# Patient Record
Sex: Male | Born: 1985 | Race: White | Hispanic: No | Marital: Married | Smoking: Never smoker
Health system: Southern US, Community
[De-identification: ages and names within clinical notes are randomized; demographics above are authoritative.]

## PROBLEM LIST (undated history)

## (undated) DIAGNOSIS — K602 Anal fissure, unspecified: Secondary | ICD-10-CM

## (undated) DIAGNOSIS — A048 Other specified bacterial intestinal infections: Secondary | ICD-10-CM

## (undated) DIAGNOSIS — T7840XA Allergy, unspecified, initial encounter: Secondary | ICD-10-CM

## (undated) HISTORY — DX: Anal fissure, unspecified: K60.2

## (undated) HISTORY — DX: Other specified bacterial intestinal infections: A04.8

## (undated) HISTORY — DX: Allergy, unspecified, initial encounter: T78.40XA

---

## 2012-03-27 ENCOUNTER — Encounter: Payer: Self-pay | Admitting: Family Medicine

## 2012-03-27 ENCOUNTER — Ambulatory Visit (INDEPENDENT_AMBULATORY_CARE_PROVIDER_SITE_OTHER): Payer: BC Managed Care – PPO | Admitting: Family Medicine

## 2012-03-27 VITALS — BP 116/72 | HR 61 | Temp 98.0°F | Resp 16 | Ht 69.0 in | Wt 207.6 lb

## 2012-03-27 DIAGNOSIS — E669 Obesity, unspecified: Secondary | ICD-10-CM | POA: Insufficient documentation

## 2012-03-27 DIAGNOSIS — Z800 Family history of malignant neoplasm of digestive organs: Secondary | ICD-10-CM | POA: Insufficient documentation

## 2012-03-27 DIAGNOSIS — K5900 Constipation, unspecified: Secondary | ICD-10-CM | POA: Insufficient documentation

## 2012-03-27 DIAGNOSIS — K625 Hemorrhage of anus and rectum: Secondary | ICD-10-CM

## 2012-03-27 DIAGNOSIS — Z0000 Encounter for general adult medical examination without abnormal findings: Secondary | ICD-10-CM

## 2012-03-27 LAB — POCT URINALYSIS DIPSTICK
Bilirubin, UA: NEGATIVE
Ketones, UA: NEGATIVE
Leukocytes, UA: NEGATIVE

## 2012-03-27 NOTE — Patient Instructions (Signed)

## 2012-03-27 NOTE — Progress Notes (Addendum)
Subjective:    Patient ID: Russell Bowen, male    DOB: 1986-02-11, 26 y.o.   MRN: [id number]  HPI   This 26 y.o. Male is here for CPE ( employee incentive program); he had labs drawn earlier and results  to be forwarded or sent to [place]. 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 He   does no strength training but is interested in weight reduction. He c/o anal bleeding associated with con-  stipation which is infrequent. He eats cereal most days of the week ( not a bran cereal). He does eat prunes  and some other fruits and vegetables.   Last eye exam: within last 2 years  Immunizations: UTD   Review of Systems  Constitutional: Negative.   HENT: Negative.   Eyes: Negative.   Respiratory: Negative.   Cardiovascular: Negative.   Gastrointestinal: Positive for constipation, blood in stool and anal bleeding. Negative for nausea, vomiting, abdominal pain, diarrhea and rectal pain.  Genitourinary: Negative.   Musculoskeletal: Negative.   Skin: Negative.   Neurological: Negative.   Hematological: Negative.   Psychiatric/Behavioral: Negative.        Objective:   Physical Exam  Nursing note and vitals reviewed. Constitutional: He is oriented to person, place, and time. He appears well-developed and well-nourished. No distress.  HENT:  Head: Normocephalic and atraumatic.  Right Ear: External ear normal.  Left Ear: External ear normal.  Nose: Nose normal.  Mouth/Throat: Oropharynx is clear and moist.  Eyes: Conjunctivae and EOM are normal. No scleral icterus.  Neck: Normal range of motion. Neck supple.       Thyroid slightly enlarged (symmetric)- no nodules, nontender  Cardiovascular: Normal rate, regular rhythm and normal heart sounds.  Exam reveals no gallop and no friction rub.   No murmur heard. Pulmonary/Chest: Breath sounds normal. No respiratory distress.  Abdominal: Soft. Bowel sounds are normal. He exhibits no distension and no mass. There is no  tenderness. There is no guarding.       No organomegaly  Genitourinary: Rectum normal, testes normal and penis normal. Right testis shows no mass, no swelling and no tenderness. Right testis is descended. Left testis shows no mass, no swelling and no tenderness. Left testis is descended.       Anus sphincter appears very tight  Musculoskeletal: Normal range of motion. He exhibits no edema and no tenderness.  Lymphadenopathy:    He has no cervical adenopathy.       Right: No inguinal adenopathy present.       Left: No inguinal adenopathy present.  Neurological: He is alert and oriented to person, place, and time. He has normal reflexes. No cranial nerve deficit. He exhibits normal muscle tone. Coordination normal.  Skin: Skin is warm and dry. No rash noted. No erythema. No pallor.  Psychiatric: He has a normal mood and affect. His behavior is normal. Judgment and thought content normal.   Results for orders placed in visit on 03/27/12  POCT URINALYSIS DIPSTICK      Component Value Range   Color, UA yellow     Clarity, UA clear     Glucose, UA neg     Bilirubin, UA neg     Ketones, UA neg     Spec Grav, UA 1.020     Blood, UA neg     pH, UA 6.0     Protein, UA neg     Urobilinogen, UA 0.2     Nitrite, UA  neg     Leukocytes, UA Negative            Assessment & Plan:   1. Routine general medical examination at a health care facility  Recent labs to be forwarded to our facility  2. Anal bleeding - suspect anal fissure (Family Hx of Colon Cancer) Hemoccult - Multi Cards (take home)  3. Constipation  Print info given; pt to make appropriate changes in lifestyle and diet   4. Obesity (BMI 30.0-34.9)  Encouraged strength training-start with push-ups, squats, pull-ups, etc.to work large muscle groups

## 2012-03-30 LAB — POC HEMOCCULT BLD/STL (HOME/3-CARD/SCREEN): Card #1 Date: NEGATIVE

## 2012-03-30 NOTE — Progress Notes (Signed)
Addended by: Gerrianne Scale on: 03/30/2012 02:25 PM   Modules accepted: Orders

## 2012-04-24 ENCOUNTER — Encounter: Payer: Self-pay | Admitting: Family Medicine

## 2013-07-31 ENCOUNTER — Ambulatory Visit (INDEPENDENT_AMBULATORY_CARE_PROVIDER_SITE_OTHER): Payer: BC Managed Care – PPO | Admitting: Family Medicine

## 2013-07-31 ENCOUNTER — Encounter: Payer: Self-pay | Admitting: Family Medicine

## 2013-07-31 VITALS — BP 116/70 | HR 63 | Temp 98.4°F | Resp 16 | Ht 69.0 in | Wt 207.6 lb

## 2013-07-31 DIAGNOSIS — J309 Allergic rhinitis, unspecified: Secondary | ICD-10-CM

## 2013-07-31 DIAGNOSIS — K5900 Constipation, unspecified: Secondary | ICD-10-CM

## 2013-07-31 DIAGNOSIS — K602 Anal fissure, unspecified: Secondary | ICD-10-CM

## 2013-07-31 DIAGNOSIS — K219 Gastro-esophageal reflux disease without esophagitis: Secondary | ICD-10-CM

## 2013-07-31 LAB — CBC WITH DIFFERENTIAL/PLATELET
Basophils Absolute: 0 10*3/uL (ref 0.0–0.1)
Lymphocytes Relative: 50 % — ABNORMAL HIGH (ref 12–46)
Lymphs Abs: 2.5 10*3/uL (ref 0.7–4.0)
MCH: 29.5 pg (ref 26.0–34.0)
Neutro Abs: 2.1 10*3/uL (ref 1.7–7.7)
Neutrophils Relative %: 41 % — ABNORMAL LOW (ref 43–77)
Platelets: 249 10*3/uL (ref 150–400)
RBC: 4.74 MIL/uL (ref 4.22–5.81)
RDW: 12.8 % (ref 11.5–15.5)
WBC: 5 10*3/uL (ref 4.0–10.5)

## 2013-07-31 LAB — COMPREHENSIVE METABOLIC PANEL
CO2: 27 mEq/L (ref 19–32)
Creat: 0.69 mg/dL (ref 0.50–1.35)
Glucose, Bld: 82 mg/dL (ref 70–99)
Total Bilirubin: 0.6 mg/dL (ref 0.3–1.2)
Total Protein: 7.1 g/dL (ref 6.0–8.3)

## 2013-07-31 LAB — TSH: TSH: 1.59 u[IU]/mL (ref 0.350–4.500)

## 2013-07-31 MED ORDER — MONTELUKAST SODIUM 10 MG PO TABS: 10.0000 mg | ORAL_TABLET | Freq: Every day | ORAL | Status: DC

## 2013-07-31 MED ORDER — OMEPRAZOLE 20 MG PO CPDR: 20.0000 mg | DELAYED_RELEASE_CAPSULE | Freq: Every day | ORAL | Status: DC

## 2013-07-31 MED ORDER — MOMETASONE FUROATE 50 MCG/ACT NA SUSP: 2.0000 | Freq: Every day | NASAL | Status: DC

## 2013-07-31 NOTE — Patient Instructions (Signed)
Cough, Adult  A cough is a reflex that helps clear your throat and airways. It can help heal the body or may be a reaction to an irritated airway. A cough may only last 2 or 3 weeks (acute) or may last more than 8 weeks (chronic).  CAUSES Acute cough:  Viral or bacterial infections. Chronic cough:  Infections.  Allergies.  Asthma.  Post-nasal drip.  Smoking.  Heartburn or acid reflux.  Some medicines.  Chronic lung problems (COPD).  Cancer. SYMPTOMS   Cough.  Fever.  Chest pain.  Increased breathing rate.  High-pitched whistling sound when breathing (wheezing).  Colored mucus that you cough up (sputum). TREATMENT   A bacterial cough may be treated with antibiotic medicine.  A viral cough must run its course and will not respond to antibiotics.  Your caregiver may recommend other treatments if you have a chronic cough. HOME CARE INSTRUCTIONS   Only take over-the-counter or prescription medicines for pain, discomfort, or fever as directed by your caregiver. Use cough suppressants only as directed by your caregiver.  Use a cold steam vaporizer or humidifier in your bedroom or home to help loosen secretions.  Sleep in a semi-upright position if your cough is worse at night.  Rest as needed.  Stop smoking if you smoke. SEEK IMMEDIATE MEDICAL CARE IF:   You have pus in your sputum.  Your cough starts to worsen.  You cannot control your cough with suppressants and are losing sleep.  You begin coughing up blood.  You have difficulty breathing.  You develop pain which is getting worse or is uncontrolled with medicine.  You have a fever. MAKE SURE YOU:   Understand these instructions.  Will watch your condition.  Will get help right away if you are not doing well or get worse. Document Released: 04/13/2011 Document Revised: 01/07/2012 Document Reviewed: 04/13/2011 [facility] Patient Information 2014 [place], [place].   Gastroesophageal Reflux  Disease, Adult Gastroesophageal reflux disease (GERD) happens when acid from your stomach flows up into the esophagus. When acid comes in contact with the esophagus, the acid causes soreness (inflammation) in the esophagus. Over time, GERD may create small holes (ulcers) in the lining of the esophagus. CAUSES   Increased body weight. This puts pressure on the stomach, making acid rise from the stomach into the esophagus.  Smoking. This increases acid production in the stomach.  Drinking alcohol. This causes decreased pressure in the lower esophageal sphincter (valve or ring of muscle between the esophagus and stomach), allowing acid from the stomach into the esophagus.  Late evening meals and a full stomach. This increases pressure and acid production in the stomach.  A malformed lower esophageal sphincter. Sometimes, no cause is found. SYMPTOMS   Burning pain in the lower part of the mid-chest behind the breastbone and in the mid-stomach area. This may occur twice a week or more often.  Trouble swallowing.  Sore throat.  Dry cough.  Asthma-like symptoms including chest tightness, shortness of breath, or wheezing. DIAGNOSIS  Your caregiver may be able to diagnose GERD based on your symptoms. In some cases, X-rays and other tests may be done to check for complications or to check the condition of your stomach and esophagus. TREATMENT  Your caregiver may recommend over-the-counter or prescription medicines to help decrease acid production. Ask your caregiver before starting or adding any new medicines.  HOME CARE INSTRUCTIONS   Change the factors that you can control. Ask your caregiver for guidance concerning weight loss, quitting smoking,  and alcohol consumption.  Avoid foods and drinks that make your symptoms worse, such as:  Caffeine or alcoholic drinks.  Chocolate.  Peppermint or mint flavorings.  Garlic and onions.  Spicy foods.  Citrus fruits, such as oranges,  lemons, or limes.  Tomato-based foods such as sauce, chili, salsa, and pizza.  Fried and fatty foods.  Avoid lying down for the 3 hours prior to your bedtime or prior to taking a nap.  Eat small, frequent meals instead of large meals.  Wear loose-fitting clothing. Do not wear anything tight around your waist that causes pressure on your stomach.  Raise the head of your bed 6 to 8 inches with wood blocks to help you sleep. Extra pillows will not help.  Only take over-the-counter or prescription medicines for pain, discomfort, or fever as directed by your caregiver.  Do not take aspirin, ibuprofen, or other nonsteroidal anti-inflammatory drugs (NSAIDs). SEEK IMMEDIATE MEDICAL CARE IF:   You have pain in your arms, neck, jaw, teeth, or back.  Your pain increases or changes in intensity or duration.  You develop nausea, vomiting, or sweating (diaphoresis).  You develop shortness of breath, or you faint.  Your vomit is green, yellow, black, or looks like coffee grounds or blood.  Your stool is red, bloody, or black. These symptoms could be signs of other problems, such as heart disease, gastric bleeding, or esophageal bleeding. MAKE SURE YOU:   Understand these instructions.  Will watch your condition.  Will get help right away if you are not doing well or get worse. Document Released: 07/25/2005 Document Revised: 01/07/2012 Document Reviewed: 05/04/2011 [facility] Patient Information 2014 [place], [place].   Diet for Gastroesophageal Reflux Disease, Adult Reflux (acid reflux) is when acid from your stomach flows up into the esophagus. When acid comes in contact with the esophagus, the acid causes irritation and soreness (inflammation) in the esophagus. When reflux happens often or so severely that it causes damage to the esophagus, it is called gastroesophageal reflux disease (GERD). Nutrition therapy can help ease the discomfort of GERD. FOODS OR DRINKS TO AVOID OR  LIMIT  Smoking or chewing tobacco. Nicotine is one of the most potent stimulants to acid production in the gastrointestinal tract.  Caffeinated and decaffeinated coffee and black tea.  Regular or low-calorie carbonated beverages or energy drinks (caffeine-free carbonated beverages are allowed).   Strong spices, such as black pepper, white pepper, red pepper, cayenne, curry powder, and chili powder.  Peppermint or spearmint.  Chocolate.  High-fat foods, including meats and fried foods. Extra added fats including oils, butter, salad dressings, and nuts. Limit these to less than 8 tsp per day.  Fruits and vegetables if they are not tolerated, such as citrus fruits or tomatoes.  Alcohol.  Any food that seems to aggravate your condition. If you have questions regarding your diet, call your caregiver or a registered dietitian. OTHER THINGS THAT MAY HELP GERD INCLUDE:   Eating your meals slowly, in a relaxed setting.  Eating 5 to 6 small meals per day instead of 3 large meals.  Eliminating food for a period of time if it causes distress.  Not lying down until 3 hours after eating a meal.  Keeping the head of your bed raised 6 to 9 inches (15 to 23 cm) by using a foam wedge or blocks under the legs of the bed. Lying flat may make symptoms worse.  Being physically active. Weight loss may be helpful in reducing reflux in overweight or obese adults.  Wear loose fitting clothing EXAMPLE MEAL PLAN This meal plan is approximately 2,000 calories based on https://www.bernard.org/ meal planning guidelines. Breakfast   cup cooked oatmeal.  1 cup strawberries.  1 cup low-fat milk.  1 oz almonds. Snack  1 cup cucumber slices.  6 oz yogurt (made from low-fat or fat-free milk). Lunch  2 slice whole-wheat bread.  2 oz sliced [place].  2 tsp mayonnaise.  1 cup blueberries.  1 cup snap peas. Snack  6 whole-wheat crackers.  1 oz string cheese. Dinner   cup brown rice.  1  cup mixed veggies.  1 tsp olive oil.  3 oz grilled fish. Document Released: 10/15/2005 Document Revised: 01/07/2012 Document Reviewed: 08/31/2011 [facility] Patient Information 2014 [place], [place].

## 2013-07-31 NOTE — Progress Notes (Signed)
Subjective:    Patient ID: Russell Bowen, male    DOB: 09/29/1986, 29 y.o.   MRN: [id number]  HPI  This 27 y.o. male is here for evaluation of several concerns. He has a chronic nonprod cough which is disruptive, especially in the workplace when in meetings. Cold drafts, air conditioning and abrupt temp changes trigger the cough; cough spells lasts for less than a minute. He has allergic rhinitis but takes OTC Loratidine prn. He does endorse rhinorrhea and PND but denies sore throat. He denies hx of Asthma or nocturnal cough, wheezing, SOB or DOE. He also notes extreme sensitivity to bug bites. He also has heartburn which he treats w/ OTC Zantac prn. Food intolerances include onions, fried foods and tomato-based foods.  Pt  also has chronic constipation and recurrent anal fissure. He notes blood in stools and is concerned because of family hx of colon cancer.   Patient Active Problem List   Diagnosis Date Noted  . Obesity (BMI 30.0-34.9) 03/27/2012  . Constipation 03/27/2012  . Family history of colon cancer 03/27/2012   PMHx, Soc Hx and Fam Hx reviewed.   Review of Systems  Constitutional: Negative.   HENT: Positive for congestion, rhinorrhea, sneezing and postnasal drip. Negative for nosebleeds, sore throat, trouble swallowing, neck pain, voice change, sinus pressure and ear discharge.   Eyes: Negative.   Respiratory: Positive for cough. Negative for chest tightness and wheezing.   Cardiovascular: Negative.   Gastrointestinal: Positive for constipation and anal bleeding. Negative for nausea, vomiting, abdominal pain, diarrhea, blood in stool and rectal pain.  Allergic/Immunologic: Positive for environmental allergies. Negative for food allergies.  All other systems reviewed and are negative.       Objective:   Physical Exam  Nursing note and vitals reviewed. Constitutional: He is oriented to person, place, and time. Vital signs are normal. He appears well-developed and  well-nourished. No distress.  HENT:  Head: Normocephalic and atraumatic.  Right Ear: Hearing, tympanic membrane, external ear and ear canal normal.  Left Ear: Hearing, tympanic membrane, external ear and ear canal normal.  Nose: Mucosal edema present. No rhinorrhea, nasal deformity or septal deviation. Right sinus exhibits no maxillary sinus tenderness and no frontal sinus tenderness. Left sinus exhibits no maxillary sinus tenderness and no frontal sinus tenderness.  Mouth/Throat: Uvula is midline and mucous membranes are normal. No oral lesions. Normal dentition. No edematous or dental caries. Posterior oropharyngeal erythema present. No oropharyngeal exudate.  Eyes: Conjunctivae, EOM and lids are normal. Pupils are equal, round, and reactive to light. No scleral icterus.  Neck: Normal range of motion and full passive range of motion without pain. Neck supple. No spinous process tenderness and no muscular tenderness present. No mass and no thyromegaly present.  Cardiovascular: Normal rate, regular rhythm, S1 normal, S2 normal, normal heart sounds and normal pulses.   No extrasystoles are present. PMI is not displaced.  Exam reveals no gallop and no friction rub.   No murmur heard. Pulmonary/Chest: Effort normal and breath sounds normal. No respiratory distress.  Abdominal: Soft. Normal appearance and bowel sounds are normal. He exhibits no mass. There is no hepatosplenomegaly. There is no tenderness. There is no guarding.  Genitourinary: Rectal exam shows fissure and anal tone abnormal. Rectal exam shows no external hemorrhoid, no mass and no tenderness. Guaiac negative stool.  Musculoskeletal: Normal range of motion.  Neurological: He is alert and oriented to person, place, and time. No cranial nerve deficit.  Skin: Skin is warm, dry and intact. No  ecchymosis and no rash noted. He is not diaphoretic. No cyanosis or erythema. No pallor. Nails show no clubbing.  Psychiatric: He has a normal mood and  affect. His behavior is normal. Judgment and thought content normal.       Assessment & Plan:  Allergic rhinitis, cause unspecified - RX: Nasonex NS for bedtime use and Singulair 10 mg 1 tab every evening.          Plan: CBC with Differential  Esophageal reflux - RX: Omeprazole 20 mg  1 tab every morning; nutrition modifications. Plan: IFOBT POC (occult bld, rslt in office), H. pylori antibody, IgG  Unspecified constipation - Plan: IFOBT POC (occult bld, rslt in office), Comprehensive metabolic panel, H. pylori antibody, IgG, CBC with Differential, Ambulatory referral to Gastroenterology, TSH  Anal fissure - Family hx of Colon cancer (not 1st degree relatives).   Plan: Ambulatory referral to Gastroenterology   Meds ordered this encounter  Medications  . DISCONTD: loratadine (CLARITIN) 10 MG tablet    Sig: Take 10 mg by mouth daily.  Marland Kitchen OVER THE COUNTER MEDICATION    Sig: OTC sinus relief prn  . OVER THE COUNTER MEDICATION    Sig: OTC Benadryl prn for bug bites  . mometasone (NASONEX) 50 MCG/ACT nasal spray    Sig: Place 2 sprays into the nose daily.    Dispense:  17 g    Refill:  12  . montelukast (SINGULAIR) 10 MG tablet    Sig: Take 1 tablet (10 mg total) by mouth at bedtime.    Dispense:  30 tablet    Refill:  3  . omeprazole (PRILOSEC) 20 MG capsule    Sig: Take 1 capsule (20 mg total) by mouth daily.    Dispense:  30 capsule    Refill:  3

## 2013-08-03 ENCOUNTER — Other Ambulatory Visit: Payer: Self-pay | Admitting: Family Medicine

## 2013-08-03 LAB — H. PYLORI ANTIBODY, IGG: H Pylori IgG: >8 {ISR} — ABNORMAL HIGH

## 2013-08-03 MED ORDER — AMOXICILL-CLARITHRO-LANSOPRAZ PO MISC: Freq: Two times a day (BID) | ORAL | Status: DC

## 2013-08-03 NOTE — Progress Notes (Signed)
Quick Note:  Please advise pt regarding following labs...  All labs are normal except test for stomach bacteria is very positive. I am prescribing a kit to be taken per package directions for 2 weeks; this kit has antibiotics as well as a medication similar to omeprazole to reduce stomach acid. After completing the kit, pt can resume the medication (omeprazole 20 mg daily). Further treatment can be discussed with the GI specialist.  Copy to pt. ______

## 2013-08-06 ENCOUNTER — Encounter: Payer: Self-pay | Admitting: Gastroenterology

## 2013-09-07 ENCOUNTER — Ambulatory Visit (INDEPENDENT_AMBULATORY_CARE_PROVIDER_SITE_OTHER): Payer: BC Managed Care – PPO | Admitting: Gastroenterology

## 2013-09-07 ENCOUNTER — Encounter: Payer: Self-pay | Admitting: Gastroenterology

## 2013-09-07 VITALS — BP 120/74 | HR 60 | Ht 69.0 in | Wt 214.0 lb

## 2013-09-07 DIAGNOSIS — Z800 Family history of malignant neoplasm of digestive organs: Secondary | ICD-10-CM

## 2013-09-07 DIAGNOSIS — K921 Melena: Secondary | ICD-10-CM

## 2013-09-07 DIAGNOSIS — K5900 Constipation, unspecified: Secondary | ICD-10-CM

## 2013-09-07 DIAGNOSIS — K602 Anal fissure, unspecified: Secondary | ICD-10-CM

## 2013-09-07 MED ORDER — DILTIAZEM GEL 2 %: 1.0000 "application " | Freq: Two times a day (BID) | CUTANEOUS | Status: DC

## 2013-09-07 MED ORDER — PEG-KCL-NACL-NASULF-NA ASC-C 100 G PO SOLR: 1.0000 | Freq: Once | ORAL | Status: DC

## 2013-09-07 NOTE — Progress Notes (Signed)
    History of Present Illness: This is a 27 year old male who relates recurrent problems with rectal pain and bright red rectal bleeding and rectal itching. He states he was diagnosed with any fissure 2008 with similar symptoms and the symptoms have been recurrent for several years. Treated with Analpram and other hemorrhoidal medications. He was recently evaluated by Dr. Audria Nine who found an anal fissure and referred him for further management. He has ongoing problems with mild constipation and hard stools clearly exacerbate his symptoms. He is 2 distant relatives with colon cancer. Denies weight loss, abdominal pain, diarrhea, change in stool caliber, melena, nausea, vomiting, dysphagia, reflux symptoms, chest pain.  Review of Systems: Pertinent positive and negative review of systems were noted in the above HPI section. All other review of systems were otherwise negative.  Current Medications, Allergies, Past Medical History, Past Surgical History, Family History and Social History were reviewed in [employer] record.  Physical Exam: General: Well developed , well nourished, no acute distress Head: Normocephalic and atraumatic Eyes:  sclerae anicteric, EOMI Ears: Normal auditory acuity Mouth: No deformity or lesions Neck: Supple, no masses or thyromegaly Lungs: Clear throughout to auscultation Heart: Regular rate and rhythm; no murmurs, rubs or bruits Abdomen: Soft, non tender and non distended. No masses, hepatosplenomegaly or hernias noted. Normal Bowel sounds Rectal: Posterior anal fissure, moderate anal canal tenderness, no lesions, moderate anal sphincter spasm, 1+ Hemoccult positive brown stool in the vault Musculoskeletal: Symmetrical with no gross deformities  Skin: No lesions on visible extremities Pulses:  Normal pulses noted Extremities: No clubbing, cyanosis, edema or deformities noted Neurological: Alert oriented x 4, grossly nonfocal Cervical Nodes:   No significant cervical adenopathy Inguinal Nodes: No significant inguinal adenopathy Psychological:  Alert and cooperative. Normal mood and affect  Assessment and Recommendations:  1. Anal fissure likely leading to rectal pain, hematochezia, Hemoccult positive stool. Begin diltiazem 2% cream twice a day for a minimum of 8 weeks. If symptoms have not completely responded continue for additional 4 weeks. Colonoscopy to rule out other causes after 8 weeks of therapy. The risks, benefits, and alternatives to colonoscopy with possible biopsy and possible polypectomy were discussed with the patient and they consent to proceed.   2. Family history of colon cancer. 2 distant relatives. PGF and paternal uncle. See above.  3. constipation. Increase dietary fiber and water intake. Begin Colace 100 mg twice daily and this does not adequately relieve his symptoms begin MiraLax once or twice daily.

## 2013-09-07 NOTE — Patient Instructions (Signed)
We have sent Diltiazem gel 2% to use rectally twice daily x 8 weeks at the minimum. This prescription has to be compounded and sent to [facility]. This is the only pharmacy that compounds this medication.   You can take over the counter Colace 1-2 x daily and if symptoms have not improved then add Miralax mixing 17 grams in 8 oz of water 1-2 x daily if needed.   Please schedule a 8 week follow up appointment with Dr. Russella Dar.  You have been scheduled for a colonoscopy with propofol. Please follow written instructions given to you at your visit today.  Please pick up your prep kit at the pharmacy within the next 1-3 days. If you use inhalers (even only as needed), please bring them with you on the day of your procedure.  Thank you for choosing me and [facility].  Venita Lick. Pleas Koch., MD., [facility] Graham

## 2013-09-08 ENCOUNTER — Encounter: Payer: Self-pay | Admitting: Gastroenterology

## 2013-11-06 ENCOUNTER — Encounter: Payer: Self-pay | Admitting: Family Medicine

## 2013-11-06 ENCOUNTER — Ambulatory Visit (INDEPENDENT_AMBULATORY_CARE_PROVIDER_SITE_OTHER): Payer: BC Managed Care – PPO | Admitting: Family Medicine

## 2013-11-06 VITALS — BP 110/70 | HR 63 | Temp 99.4°F | Resp 16 | Ht 69.0 in | Wt 212.6 lb

## 2013-11-06 DIAGNOSIS — R05 Cough: Secondary | ICD-10-CM | POA: Insufficient documentation

## 2013-11-06 DIAGNOSIS — R053 Chronic cough: Secondary | ICD-10-CM

## 2013-11-06 DIAGNOSIS — R059 Cough, unspecified: Secondary | ICD-10-CM

## 2013-11-06 NOTE — Progress Notes (Signed)
S:  This 28 y.o. Male returns for evaluation of chronic NP cough. Current medications are not completely effective. Pt works in an environment where the humidity or lack thereof may be a contributing factor. He wants to have further evaluation including CXR today. Pt denies SOB or DOE, fatigue, weight loss, diaphoresis, pleuritic CP or tightness, palpitations, orthopnea, edema, rashes, dizziness or syncope.  Pt did have very + H. Pylori test and completed PREVPAC in Nov 2014. It is not clear if he continues to take PPI or if this issue was addressed by GI specialist (Dr. Russella DarStark).  Patient Active Problem List   Diagnosis Date Noted  . Chronic cough 11/06/2013  . Obesity (BMI 30.0-34.9) 03/27/2012  . Constipation 03/27/2012  . Family history of colon cancer 03/27/2012   ROS: As per HPI.  O:  Filed Vitals:   11/06/13 1418  BP: 110/70  Pulse: 63  Temp: 99.4 F (37.4 C)  Resp: 16   GEN: In NAD; WN,WD. HEENT:[place]/AT; EOMI w/ clear conj/sclerae. EACs/TMs normal. Nasal mucosa normal w/o edema or rhinorrhea. Post ph erythematous w/o lesions or exudates. NECK: Supple w/o LAN or TMG. LUNGS: CTA; no wheezes or rales. Normal resp rate and effort. COR: RRR; normal S1 and S2 w/o m/g/r. SKIN: W&D; intact w/o diaphoresis, erythema, ecchymoses or pallor. MS: MAEs; no joint deformities or effusions. Normal muscle tone w/o atrophy. NEURO: A&O x 3; CNs intact. Nonfocal.   A/P: Chronic cough - Plan: CXR was ordered but pt had to leave before staff available to perform imaging study. I phoned him and he will reschedule OV so that CXR and other evaluation can be completed.

## 2013-11-16 ENCOUNTER — Encounter: Payer: Self-pay | Admitting: Gastroenterology

## 2013-11-16 ENCOUNTER — Ambulatory Visit (INDEPENDENT_AMBULATORY_CARE_PROVIDER_SITE_OTHER): Payer: BC Managed Care – PPO | Admitting: Gastroenterology

## 2013-11-16 VITALS — BP 124/74 | HR 88 | Ht 69.0 in | Wt 217.0 lb

## 2013-11-16 DIAGNOSIS — K602 Anal fissure, unspecified: Secondary | ICD-10-CM

## 2013-11-16 DIAGNOSIS — R195 Other fecal abnormalities: Secondary | ICD-10-CM

## 2013-11-16 DIAGNOSIS — K921 Melena: Secondary | ICD-10-CM

## 2013-11-16 NOTE — Progress Notes (Signed)
    History of Present Illness: This is a 28 year old male who returns for follow up of an anal fissure. All symptoms have completely resolved.  Current Medications, Allergies, Past Medical History, Past Surgical History, Family History and Social History were reviewed in [facility] electronic medical record.  Physical Exam: General: Well developed , well nourished, no acute distress Head: Normocephalic and atraumatic Eyes:  sclerae anicteric, EOMI Ears: Normal auditory acuity Mouth: No deformity or lesions Lungs: Clear throughout to auscultation Heart: Regular rate and rhythm; no murmurs, rubs or bruits Abdomen: Soft, non tender and non distended. No masses, hepatosplenomegaly or hernias noted. Normal Bowel sounds Rectal: Deferred to colonoscopy Musculoskeletal: Symmetrical with no gross deformities  Pulses:  Normal pulses noted Extremities: No clubbing, cyanosis, edema or deformities noted Neurological: Alert oriented x 4, grossly nonfocal Psychological:  Alert and cooperative. Normal mood and affect  Assessment and Recommendations:  1. Hematochezia, Hemoccult positive stool. Anal fissure which has symptomatically resolved. Schedule colonoscopy. The risks, benefits, and alternatives to colonoscopy with possible biopsy and possible polypectomy were discussed with the patient and they consent to proceed.

## 2013-11-16 NOTE — Patient Instructions (Signed)
Please keep your scheduled Colonoscopy appointment on 12/17/13.   Thank you for choosing me and [facility].  Venita LickMalcolm T. Pleas Bowen, Jr., MD., Russell GrahamFACG

## 2013-11-19 ENCOUNTER — Encounter: Payer: BC Managed Care – PPO | Admitting: Gastroenterology

## 2013-12-07 ENCOUNTER — Telehealth: Payer: Self-pay | Admitting: Gastroenterology

## 2013-12-07 MED ORDER — DILTIAZEM GEL 2 %: 1.0000 "application " | Freq: Two times a day (BID) | CUTANEOUS | Status: DC

## 2013-12-07 NOTE — Telephone Encounter (Signed)
OK to refill diltiazem. OK to reschedule if he would like to in 4-6 weeks. Only reason to reschedule is if his rectal pain with BMs is so severe that he feels the prep would be too much to do.

## 2013-12-07 NOTE — Telephone Encounter (Signed)
Patient would like to reschedule.  He is rescheduled to the next available 02/02/14

## 2013-12-07 NOTE — Telephone Encounter (Signed)
Patient was seen in January and his rectal fissure had symptomatically resolved.  He is scheduled for a colonoscopy on 12/17/13.  He is requesting a refill on diltiazem gel because his pain and bleeding reoccurred last night.  He is asking if he should reschedule his colonoscopy or keep appt for 12/17/13.  He states you discussed that you did not want him to have a colonoscopy when he was symptomatic.  Please advise

## 2013-12-17 ENCOUNTER — Encounter: Payer: BC Managed Care – PPO | Admitting: Gastroenterology

## 2013-12-29 ENCOUNTER — Encounter: Payer: BC Managed Care – PPO | Admitting: Gastroenterology

## 2014-01-28 ENCOUNTER — Telehealth: Payer: Self-pay | Admitting: Gastroenterology

## 2014-01-29 NOTE — Telephone Encounter (Signed)
Yes, charge. 

## 2014-02-02 ENCOUNTER — Encounter: Payer: BC Managed Care – PPO | Admitting: Gastroenterology

## 2014-03-30 ENCOUNTER — Telehealth: Payer: Self-pay | Admitting: Gastroenterology

## 2014-03-30 NOTE — Telephone Encounter (Signed)
Patient was scheduled for a colonoscopy, but he had death in the family and while he was out of the country attending the funeral he had a colonoscopy.  He does have the procedure on a CD disk and a copy of the pathology report and will bring it by the office tomorrow for Dr. Russella Dar to review

## 2014-03-31 NOTE — Telephone Encounter (Signed)
Patient dropped off a disk of his colonoscopy( he does not have a paper copy of the report) and a pathology report ( in [place]) from his procedure performed in [place].  Patient would like you to review the pathology report (unable to find a computer that can open the disk here in the office so far) Does he just need a follow up office visit with you?  I will place the disk and path report in your office.

## 2014-04-01 NOTE — Telephone Encounter (Signed)
Left message for patient to call back  

## 2014-04-01 NOTE — Telephone Encounter (Signed)
Patient notified.  He will fax a copy of the procedure report when he gets it.  I have scheduled a follow up on 04/19/14

## 2014-04-01 NOTE — Telephone Encounter (Signed)
We need a paper copy of the colonoscopy report in English to review path and colonoscopy report together.

## 2014-04-19 ENCOUNTER — Encounter: Payer: Self-pay | Admitting: Gastroenterology

## 2014-04-19 ENCOUNTER — Ambulatory Visit (INDEPENDENT_AMBULATORY_CARE_PROVIDER_SITE_OTHER): Payer: BC Managed Care – PPO | Admitting: Gastroenterology

## 2014-04-19 VITALS — BP 114/70 | HR 68 | Ht 69.0 in | Wt 202.5 lb

## 2014-04-19 DIAGNOSIS — K602 Anal fissure, unspecified: Secondary | ICD-10-CM

## 2014-04-19 DIAGNOSIS — R933 Abnormal findings on diagnostic imaging of other parts of digestive tract: Secondary | ICD-10-CM

## 2014-04-19 MED ORDER — MOVIPREP 100 G PO SOLR: ORAL | Status: DC

## 2014-04-19 NOTE — Progress Notes (Signed)
History of Present Illness: This is a 28-year-old male returns to discuss history of an anal fissure, colonoscopy result and pathology report. He has no gastrointestinal complaints at this time except for mild constipation. While he was in [place] for the funeral of a relative he underwent colonoscopy on 03/11/2014 which was normal to the cecum and biopsies of normal-appearing ileocecal valve were obtained. The pathology report read: Non-specific chronic inflammation. No crypt abscess sees or granulomata were seen in the levels examined. Patient is concerned he may have underlying Crohn's disease that has not been diagnosed.  Current Medications, Allergies, Past Medical History, Past Surgical History, Family History and Social History were reviewed in [facility] electronic medical record.  Physical Exam: General: Well developed , well nourished, no acute distress Head: Normocephalic and atraumatic Eyes:  sclerae anicteric, EOMI Ears: Normal auditory acuity Mouth: No deformity or lesions Lungs: Clear throughout to auscultation Heart: Regular rate and rhythm; no murmurs, rubs or bruits Abdomen: Soft, non tender and non distended. No masses, hepatosplenomegaly or hernias noted. Normal Bowel sounds Musculoskeletal: Symmetrical with no gross deformities  Pulses:  Normal pulses noted Extremities: No clubbing, cyanosis, edema or deformities noted Neurological: Alert oriented x 4, grossly nonfocal Psychological:  Alert and cooperative. Normal mood and affect  Assessment and Recommendations:  1. History of an anal fissure. No symptoms or colonoscopy findings to suggest Crohn's disease. He is very concerned about the nonspecific chronic inflammation noted on biopsy. I explained this is very unlikely to be Crohn's disease. He remains concerned and would like a more definitive answer. It would be very difficult to obtain the biopsies for further review and the terminal ileum was not examined. I  recommended observing for further symptoms and considering CRP, ESR and IBD antibody panels however these are not as definitive as colonoscopy. He was like to proceed with colonoscopy with biopsies and with attempt to examine terminal ileum. 

## 2014-04-19 NOTE — Patient Instructions (Signed)
You have been scheduled for a colonoscopy. Please follow written instructions given to you at your visit today.  Please pick up your prep kit at the pharmacy within the next 1-3 days. If you use inhalers (even only as needed), please bring them with you on the day of your procedure. Your physician has requested that you go to www.startemmi.com and enter the access code given to you at your visit today. This web site gives a general overview about your procedure. However, you should still follow specific instructions given to you by our office regarding your preparation for the procedure.  We have sent the following medications to your pharmacy for you to pick up at your convenience: Moviprep

## 2014-04-23 ENCOUNTER — Encounter: Payer: Self-pay | Admitting: Gastroenterology

## 2014-06-24 ENCOUNTER — Encounter: Payer: Self-pay | Admitting: Gastroenterology

## 2014-06-24 ENCOUNTER — Ambulatory Visit (AMBULATORY_SURGERY_CENTER): Payer: Managed Care, Other (non HMO) | Admitting: Gastroenterology

## 2014-06-24 VITALS — BP 123/70 | HR 77 | Temp 97.9°F | Resp 27 | Ht 69.0 in | Wt 202.0 lb

## 2014-06-24 DIAGNOSIS — R933 Abnormal findings on diagnostic imaging of other parts of digestive tract: Secondary | ICD-10-CM

## 2014-06-24 MED ORDER — SODIUM CHLORIDE 0.9 % IV SOLN: 500.0000 mL | INTRAVENOUS | Status: DC

## 2014-06-24 NOTE — Patient Instructions (Signed)
Discharge instructions given with verbal understanding. Biopsies taken taken. Resume previous medications. YOU HAD AN ENDOSCOPIC PROCEDURE TODAY AT THE [facility]: Refer to the procedure report that was given to you for any specific questions about what was found during the examination.  If the procedure report does not answer your questions, please call your gastroenterologist to clarify.  If you requested that your care partner not be given the details of your procedure findings, then the procedure report has been included in a sealed envelope for you to review at your convenience later.  YOU SHOULD EXPECT: Some feelings of bloating in the abdomen. Passage of more gas than usual.  Walking can help get rid of the air that was put into your GI tract during the procedure and reduce the bloating. If you had a lower endoscopy (such as a colonoscopy or flexible sigmoidoscopy) you may notice spotting of blood in your stool or on the toilet paper. If you underwent a bowel prep for your procedure, then you may not have a normal bowel movement for a few days.  DIET: Your first meal following the procedure should be a light meal and then it is ok to progress to your normal diet.  A half-sandwich or bowl of soup is an example of a good first meal.  Heavy or fried foods are harder to digest and may make you feel nauseous or bloated.  Likewise meals heavy in dairy and vegetables can cause extra gas to form and this can also increase the bloating.  Drink plenty of fluids but you should avoid alcoholic beverages for 24 hours.  ACTIVITY: Your care partner should take you home directly after the procedure.  You should plan to take it easy, moving slowly for the rest of the day.  You can resume normal activity the day after the procedure however you should NOT DRIVE or use heavy machinery for 24 hours (because of the sedation medicines used during the test).    SYMPTOMS TO REPORT IMMEDIATELY: A  gastroenterologist can be reached at any hour.  During normal business hours, 8:30 AM to 5:00 PM Monday through Friday, call [phone].  After hours and on weekends, please call the GI answering service at [phone] who will take a message and have the physician on call contact you.   Following lower endoscopy (colonoscopy or flexible sigmoidoscopy):  Excessive amounts of blood in the stool  Significant tenderness or worsening of abdominal pains  Swelling of the abdomen that is new, acute  Fever of 100F or higher  FOLLOW UP: If any biopsies were taken you will be contacted by phone or by letter within the next 1-3 weeks.  Call your gastroenterologist if you have not heard about the biopsies in 3 weeks.  Our staff will call the home number listed on your records the next business day following your procedure to check on you and address any questions or concerns that you may have at that time regarding the information given to you following your procedure. This is a courtesy call and so if there is no answer at the home number and we have not heard from you through the emergency physician on call, we will assume that you have returned to your regular daily activities without incident.  SIGNATURES/CONFIDENTIALITY: You and/or your care partner have signed paperwork which will be entered into your electronic medical record.  These signatures attest to the fact that that the information above on your After Visit Summary has been  reviewed and is understood.  Full responsibility of the confidentiality of this discharge information lies with you and/or your care-partner. 

## 2014-06-24 NOTE — Progress Notes (Signed)
A/ox3 pleased with MAC, report to Celia RN 

## 2014-06-24 NOTE — Op Note (Signed)
[facility] [address] [place], [address]   COLONOSCOPY PROCEDURE REPORT  PATIENT: Russell Bowen, Russell Bowen  MR#: [id number] BIRTHDATE: May 13, 1986 , 28  yrs. old GENDER: Male ENDOSCOPIST: Meryl Dare, MD, [facility] REFERRED XB:[id number] McPherson, M.D. PROCEDURE DATE:  06/24/2014 PROCEDURE:   Colonoscopy with biopsy First Screening Colonoscopy - Avg.  risk and is 50 yrs.  old or older - No.  Prior Negative Screening - Now for repeat screening. N/A  History of Adenoma - Now for follow-up colonoscopy & has been > or = to 3 yrs.  N/A  Polyps Removed Today? No.  Recommend repeat exam, <10 yrs? No. ASA CLASS:   Class II INDICATIONS:an abnormal imaging, NOS. MEDICATIONS: MAC sedation, administered by CRNA and propofol (Diprivan)  IV DESCRIPTION OF PROCEDURE:   After the risks benefits and alternatives of the procedure were thoroughly explained, informed consent was obtained.  A digital rectal exam revealed no abnormalities of the rectum.   The LB [id number]  endoscope was introduced through the anus and advanced to the terminal ileum which was intubated for a short distance. No adverse events experienced.   The quality of the prep was excellent, using MoviPrep  The instrument was then slowly withdrawn as the colon was fully examined.  COLON FINDINGS: A normal appearing cecum, ileocecal valve, and appendiceal orifice were identified.  The ascending, hepatic flexure, transverse, splenic flexure, descending, sigmoid colon and rectum appeared unremarkable.  No polyps or cancers were seen.  A biopsy of the IC valve was performed due to a prior abormal biopsy in this area.   The mucosa appeared normal in the terminal ileum. Retroflexed views revealed small internal hemorrhoids. The time to cecum=2 minutes 17 seconds.  Withdrawal time=9 minutes 19 seconds. The scope was withdrawn and the procedure completed.  COMPLICATIONS: There were no complications.  ENDOSCOPIC  IMPRESSION: 1.   Normal colon; biopsy of the IC valve performed 2.   Normal mucosa in the terminal ileum 3.   Small internal hemorrhoids  RECOMMENDATIONS: 1.  Await pathology results  eSigned:  Meryl Dare, MD, [facility] 06/24/2014 10:18 AM

## 2014-06-24 NOTE — Progress Notes (Signed)
Called to room to assist during endoscopic procedure.  Patient ID and intended procedure confirmed with present staff. Received instructions for my participation in the procedure from the performing physician.  

## 2014-06-25 ENCOUNTER — Telehealth: Payer: Self-pay | Admitting: *Deleted

## 2014-06-25 NOTE — Telephone Encounter (Signed)
No answer, message left for the patient. 

## 2014-07-02 ENCOUNTER — Encounter: Payer: Self-pay | Admitting: Gastroenterology

## 2016-09-11 ENCOUNTER — Encounter (HOSPITAL_COMMUNITY): Payer: Self-pay | Admitting: Emergency Medicine

## 2016-09-11 ENCOUNTER — Emergency Department (HOSPITAL_COMMUNITY): Payer: 59

## 2016-09-11 ENCOUNTER — Emergency Department (HOSPITAL_COMMUNITY)
Admission: EM | Admit: 2016-09-11 | Discharge: 2016-09-11 | Disposition: A | Discharge status: Home / Self Care | Payer: 59 | Attending: Emergency Medicine | Admitting: Emergency Medicine

## 2016-09-11 DIAGNOSIS — Y999 Unspecified external cause status: Secondary | ICD-10-CM | POA: Diagnosis not present

## 2016-09-11 DIAGNOSIS — S62362A Nondisplaced fracture of neck of third metacarpal bone, right hand, initial encounter for closed fracture: Secondary | ICD-10-CM

## 2016-09-11 DIAGNOSIS — S62332A Displaced fracture of neck of third metacarpal bone, right hand, initial encounter for closed fracture: Secondary | ICD-10-CM | POA: Diagnosis not present

## 2016-09-11 DIAGNOSIS — S6991XA Unspecified injury of right wrist, hand and finger(s), initial encounter: Secondary | ICD-10-CM | POA: Diagnosis present

## 2016-09-11 DIAGNOSIS — Y939 Activity, unspecified: Secondary | ICD-10-CM | POA: Insufficient documentation

## 2016-09-11 DIAGNOSIS — Y92410 Unspecified street and highway as the place of occurrence of the external cause: Secondary | ICD-10-CM | POA: Diagnosis not present

## 2016-09-11 DIAGNOSIS — S62324A Displaced fracture of shaft of fourth metacarpal bone, right hand, initial encounter for closed fracture: Secondary | ICD-10-CM | POA: Diagnosis not present

## 2016-09-11 MED ORDER — HYDROCODONE-ACETAMINOPHEN 5-325 MG PO TABS: 1.0000 | ORAL_TABLET | Freq: Three times a day (TID) | ORAL | 0 refills | Status: AC | PRN

## 2016-09-11 MED ORDER — ACETAMINOPHEN 500 MG PO TABS: 1000.0000 mg | ORAL_TABLET | Freq: Once | ORAL | Status: DC

## 2016-09-11 MED ORDER — ACETAMINOPHEN 500 MG PO TABS: 1000.0000 mg | ORAL_TABLET | Freq: Three times a day (TID) | ORAL | 0 refills | Status: AC

## 2016-09-11 NOTE — ED Notes (Signed)
Discharged vitals in. 

## 2016-09-11 NOTE — Progress Notes (Signed)
Orthopedic Tech Progress Note Patient Details:  Fredderick SeveranceMohammed Joerger [birth year] [id number]  Ortho Devices Type of Ortho Device: Ace wrap, Ulna gutter splint Ortho Device/Splint Location: RUE Ortho Device/Splint Interventions: Ordered, Application   Jennye MoccasinHughes, Shainna Faux Craig 09/11/2016, 4:48 PM
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
## 2016-09-11 NOTE — ED Notes (Signed)
Pt stable, understands discharge instructions, and reasons for return.   

## 2016-10-31 DIAGNOSIS — S62354D Nondisplaced fracture of shaft of fourth metacarpal bone, right hand, subsequent encounter for fracture with routine healing: Secondary | ICD-10-CM | POA: Diagnosis not present

## 2016-11-01 DIAGNOSIS — M79641 Pain in right hand: Secondary | ICD-10-CM | POA: Diagnosis not present

## 2016-11-22 DIAGNOSIS — S62354D Nondisplaced fracture of shaft of fourth metacarpal bone, right hand, subsequent encounter for fracture with routine healing: Secondary | ICD-10-CM | POA: Diagnosis not present
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
## 2017-09-07 DIAGNOSIS — J069 Acute upper respiratory infection, unspecified: Secondary | ICD-10-CM | POA: Diagnosis not present

## 2017-09-07 DIAGNOSIS — R05 Cough: Secondary | ICD-10-CM | POA: Diagnosis not present

## 2018-02-05 DIAGNOSIS — R05 Cough: Secondary | ICD-10-CM | POA: Diagnosis not present

## 2018-03-10 DIAGNOSIS — Z0000 Encounter for general adult medical examination without abnormal findings: Secondary | ICD-10-CM | POA: Diagnosis not present

## 2018-03-10 DIAGNOSIS — R0981 Nasal congestion: Secondary | ICD-10-CM | POA: Diagnosis not present

## 2018-03-10 DIAGNOSIS — Z136 Encounter for screening for cardiovascular disorders: Secondary | ICD-10-CM | POA: Diagnosis not present

## 2018-03-10 DIAGNOSIS — Z23 Encounter for immunization: Secondary | ICD-10-CM | POA: Diagnosis not present

## 2018-12-04 DIAGNOSIS — J3489 Other specified disorders of nose and nasal sinuses: Secondary | ICD-10-CM | POA: Diagnosis not present

## 2019-02-20 DIAGNOSIS — J3489 Other specified disorders of nose and nasal sinuses: Secondary | ICD-10-CM | POA: Diagnosis not present

## 2019-03-12 DIAGNOSIS — Z0000 Encounter for general adult medical examination without abnormal findings: Secondary | ICD-10-CM | POA: Diagnosis not present

## 2019-03-17 DIAGNOSIS — Z13220 Encounter for screening for lipoid disorders: Secondary | ICD-10-CM | POA: Diagnosis not present

## 2019-03-17 DIAGNOSIS — Z0000 Encounter for general adult medical examination without abnormal findings: Secondary | ICD-10-CM | POA: Diagnosis not present

## 2019-07-24 DIAGNOSIS — R0981 Nasal congestion: Secondary | ICD-10-CM | POA: Diagnosis not present

## 2019-07-29 DIAGNOSIS — Z23 Encounter for immunization: Secondary | ICD-10-CM | POA: Diagnosis not present

## 2020-01-08 ENCOUNTER — Ambulatory Visit: Payer: Managed Care, Other (non HMO) | Attending: Internal Medicine

## 2020-01-08 DIAGNOSIS — Z23 Encounter for immunization: Secondary | ICD-10-CM

## 2020-01-08 NOTE — Progress Notes (Signed)
   Cov[facility] Vaccination [facility]  Name:  Russell Bowen    MRN: [id number] DOB: July 23, 1986  01/08/2020  [name] was observed post Covid-19 immunization for 15 minutes without incident. He was provided with Vaccine Information Sheet and instruction to access the V-Safe system.   [name] was instructed to call 911 with any severe reactions post vaccine: Marland Kitchen Difficulty breathing  . Swelling of face and throat  . A fast heartbeat  . A bad rash all over body  . Dizziness and weakness   Immunizations Administered    Name Date Dose VIS Date Route   Pfizer COVID-19 Vaccine 01/08/2020 10:10 AM 0.3 mL 10/09/2019 Intramuscular   Manufacturer: [employer], [employer]   Lot: [id number]   NDC: [address]-6

## 2020-02-03 ENCOUNTER — Ambulatory Visit: Payer: Managed Care, Other (non HMO) | Attending: Internal Medicine

## 2020-02-03 DIAGNOSIS — Z23 Encounter for immunization: Secondary | ICD-10-CM

## 2020-02-03 NOTE — Progress Notes (Signed)
   Covid-19 Vaccination Clinic  Name:  Autrey Human    MRN: [id number] DOB: 01-30-1986  02/03/2020  Mr. Bucio was observed post Covid-19 immunization for 15 minutes without incident. He was provided with Vaccine Information Sheet and instruction to access the V-Safe system.   Mr. Siefker was instructed to call 911 with any severe reactions post vaccine: Marland Kitchen Difficulty breathing  . Swelling of face and throat  . A fast heartbeat  . A bad rash all over body  . Dizziness and weakness   Immunizations Administered    Name Date Dose VIS Date Route   Pfizer COVID-19 Vaccine 02/03/2020 10:02 AM 0.3 mL 10/09/2019 Intramuscular   Manufacturer: [employer], [employer]   Lot: [id number]   NDC: [address]-4

## 2020-05-12 DIAGNOSIS — J3489 Other specified disorders of nose and nasal sinuses: Secondary | ICD-10-CM | POA: Diagnosis not present

## 2020-05-12 DIAGNOSIS — Z6833 Body mass index (BMI) 33.0-33.9, adult: Secondary | ICD-10-CM | POA: Diagnosis not present

## 2020-05-12 DIAGNOSIS — Z0000 Encounter for general adult medical examination without abnormal findings: Secondary | ICD-10-CM | POA: Diagnosis not present

## 2020-05-12 DIAGNOSIS — Z13220 Encounter for screening for lipoid disorders: Secondary | ICD-10-CM | POA: Diagnosis not present

## 2020-05-12 DIAGNOSIS — J309 Allergic rhinitis, unspecified: Secondary | ICD-10-CM | POA: Diagnosis not present

## 2020-05-12 DIAGNOSIS — Z131 Encounter for screening for diabetes mellitus: Secondary | ICD-10-CM | POA: Diagnosis not present

## 2020-05-27 DIAGNOSIS — Z1152 Encounter for screening for COVID-19: Secondary | ICD-10-CM | POA: Diagnosis not present

## 2020-05-27 DIAGNOSIS — Z20822 Contact with and (suspected) exposure to covid-19: Secondary | ICD-10-CM | POA: Diagnosis not present

## 2020-08-24 DIAGNOSIS — Z23 Encounter for immunization: Secondary | ICD-10-CM | POA: Diagnosis not present

## 2020-12-03 DIAGNOSIS — Z20822 Contact with and (suspected) exposure to covid-19: Secondary | ICD-10-CM | POA: Diagnosis not present

## 2020-12-08 DIAGNOSIS — Z20822 Contact with and (suspected) exposure to covid-19: Secondary | ICD-10-CM | POA: Diagnosis not present

## 2021-05-16 DIAGNOSIS — Z0000 Encounter for general adult medical examination without abnormal findings: Secondary | ICD-10-CM | POA: Diagnosis not present

## 2021-05-16 DIAGNOSIS — Z13220 Encounter for screening for lipoid disorders: Secondary | ICD-10-CM | POA: Diagnosis not present

## 2021-05-16 DIAGNOSIS — Z131 Encounter for screening for diabetes mellitus: Secondary | ICD-10-CM | POA: Diagnosis not present

## 2021-06-20 DIAGNOSIS — L814 Other melanin hyperpigmentation: Secondary | ICD-10-CM | POA: Diagnosis not present

## 2021-06-20 DIAGNOSIS — D225 Melanocytic nevi of trunk: Secondary | ICD-10-CM | POA: Diagnosis not present

## 2021-06-20 DIAGNOSIS — D485 Neoplasm of uncertain behavior of skin: Secondary | ICD-10-CM | POA: Diagnosis not present

## 2021-06-20 DIAGNOSIS — B356 Tinea cruris: Secondary | ICD-10-CM | POA: Diagnosis not present

## 2022-06-08 DIAGNOSIS — Z13220 Encounter for screening for lipoid disorders: Secondary | ICD-10-CM | POA: Diagnosis not present

## 2022-06-08 DIAGNOSIS — Z125 Encounter for screening for malignant neoplasm of prostate: Secondary | ICD-10-CM | POA: Diagnosis not present

## 2022-06-08 DIAGNOSIS — Z0000 Encounter for general adult medical examination without abnormal findings: Secondary | ICD-10-CM | POA: Diagnosis not present

## 2022-12-13 DIAGNOSIS — D2372 Other benign neoplasm of skin of left lower limb, including hip: Secondary | ICD-10-CM | POA: Diagnosis not present

## 2022-12-13 DIAGNOSIS — L918 Other hypertrophic disorders of the skin: Secondary | ICD-10-CM | POA: Diagnosis not present

## 2022-12-13 DIAGNOSIS — L814 Other melanin hyperpigmentation: Secondary | ICD-10-CM | POA: Diagnosis not present

## 2022-12-13 DIAGNOSIS — L821 Other seborrheic keratosis: Secondary | ICD-10-CM | POA: Diagnosis not present

## 2023-06-10 DIAGNOSIS — Z125 Encounter for screening for malignant neoplasm of prostate: Secondary | ICD-10-CM | POA: Diagnosis not present

## 2023-06-10 DIAGNOSIS — E785 Hyperlipidemia, unspecified: Secondary | ICD-10-CM | POA: Diagnosis not present

## 2023-06-10 DIAGNOSIS — Z0000 Encounter for general adult medical examination without abnormal findings: Secondary | ICD-10-CM | POA: Diagnosis not present

## 2023-08-17 DIAGNOSIS — Z23 Encounter for immunization: Secondary | ICD-10-CM | POA: Diagnosis not present

## 2024-06-17 DIAGNOSIS — E785 Hyperlipidemia, unspecified: Secondary | ICD-10-CM | POA: Diagnosis not present

## 2024-06-17 DIAGNOSIS — Z0000 Encounter for general adult medical examination without abnormal findings: Secondary | ICD-10-CM | POA: Diagnosis not present

## 2024-06-17 DIAGNOSIS — J309 Allergic rhinitis, unspecified: Secondary | ICD-10-CM | POA: Diagnosis not present

## 2024-06-17 DIAGNOSIS — R140 Abdominal distension (gaseous): Secondary | ICD-10-CM | POA: Diagnosis not present
